# Patient Record
Sex: Male | Born: 1995 | Race: White | Hispanic: No | Marital: Single | Smoking: Never smoker
Health system: Southern US, Community
[De-identification: ages and names within clinical notes are randomized; demographics above are authoritative.]

## PROBLEM LIST (undated history)

## (undated) HISTORY — PX: APPENDECTOMY: SHX54

---

## 2015-01-18 ENCOUNTER — Encounter (HOSPITAL_BASED_OUTPATIENT_CLINIC_OR_DEPARTMENT_OTHER): Payer: Self-pay | Admitting: *Deleted

## 2015-01-18 ENCOUNTER — Emergency Department (HOSPITAL_BASED_OUTPATIENT_CLINIC_OR_DEPARTMENT_OTHER): Payer: 59

## 2015-01-18 ENCOUNTER — Emergency Department (HOSPITAL_BASED_OUTPATIENT_CLINIC_OR_DEPARTMENT_OTHER)
Admission: EM | Admit: 2015-01-18 | Discharge: 2015-01-18 | Disposition: A | Discharge status: Home / Self Care | Payer: 59 | Attending: Emergency Medicine | Admitting: Emergency Medicine

## 2015-01-18 DIAGNOSIS — S93402A Sprain of unspecified ligament of left ankle, initial encounter: Secondary | ICD-10-CM | POA: Diagnosis not present

## 2015-01-18 DIAGNOSIS — Y998 Other external cause status: Secondary | ICD-10-CM | POA: Diagnosis not present

## 2015-01-18 DIAGNOSIS — Y92310 Basketball court as the place of occurrence of the external cause: Secondary | ICD-10-CM | POA: Diagnosis not present

## 2015-01-18 DIAGNOSIS — X58XXXA Exposure to other specified factors, initial encounter: Secondary | ICD-10-CM | POA: Insufficient documentation

## 2015-01-18 DIAGNOSIS — Y9367 Activity, basketball: Secondary | ICD-10-CM | POA: Diagnosis not present

## 2015-01-18 DIAGNOSIS — S99912A Unspecified injury of left ankle, initial encounter: Secondary | ICD-10-CM | POA: Diagnosis present

## 2015-01-18 MED ORDER — NAPROXEN 500 MG PO TABS: 500.0000 mg | ORAL_TABLET | Freq: Two times a day (BID) | ORAL | Status: AC

## 2015-01-18 MED ORDER — HYDROCODONE-ACETAMINOPHEN 5-325 MG PO TABS
1.0000 | ORAL_TABLET | Freq: Once | ORAL | Status: AC
  Administered 2015-01-18: 1 via ORAL
  Filled 2015-01-18: qty 1

## 2015-01-18 NOTE — ED Notes (Signed)
Injury to his left ankle while playing basketball yesterday. Painful to ambulate. Bruising.

## 2015-01-18 NOTE — ED Provider Notes (Signed)
CSN: [id number]     Arrival date & time 01/18/15  2026 History   First MD Initiated Contact with Patient 01/18/15 2050     Chief Complaint  Patient presents with  . Ankle Injury     (Consider location/radiation/quality/duration/timing/severity/associated sxs/prior Treatment) HPI Comments: Patient presents with acute onset of left ankle pain starting yesterday when he twisted his ankle inward while playing basketball. Patient felt and heard a pop. He was able to ambulate however had a lot of pain with ambulation. He had the ankle wrapped with an Ace wrap. Patient took ibuprofen which provided some relief. He was able to be up and about on his ankle today. No knee pain or hip pain. No other treatments prior to arrival.  The history is provided by the patient.    History reviewed. No pertinent past medical history. Past Surgical History  Procedure Laterality Date  . Appendectomy     No family history on file. Social History  Substance Use Topics  . Smoking status: Never Smoker   . Smokeless tobacco: None  . Alcohol Use: No    Review of Systems  Constitutional: Negative for activity change.  Musculoskeletal: Positive for joint swelling, arthralgias and gait problem. Negative for back pain and neck pain.  Skin: Negative for wound.  Neurological: Negative for weakness and numbness.      Allergies  Review of patient's allergies indicates no known allergies.  Home Medications   Prior to Admission medications   Not on File   BP 116/58 mmHg  Pulse 55  Temp(Src) 98.3 F (36.8 C) (Oral)  Resp 20  Ht  (1.854 m)  Wt 171 lb (77.565 kg)  BMI 22.57 kg/m2  SpO2 99% Physical Exam  Constitutional: He appears well-developed and well-nourished.  HENT:  Head: Normocephalic and atraumatic.  Eyes: Conjunctivae are normal.  Neck: Normal range of motion. Neck supple.  Cardiovascular: Normal pulses.   Pulses:      Dorsalis pedis pulses are 2+ on the right side, and 2+ on the  left side.       Posterior tibial pulses are 2+ on the right side, and 2+ on the left side.  Pulmonary/Chest: No respiratory distress.  Musculoskeletal: He exhibits edema and tenderness.       Left hip: Normal.       Left knee: Normal.       Left ankle: He exhibits decreased range of motion and swelling. He exhibits no ecchymosis. Tenderness. Lateral malleolus (Significant overlying swelling) tenderness found. No head of 5th metatarsal and no proximal fibula tenderness found. Achilles tendon normal. Achilles tendon exhibits normal Thompson's test results.       Left lower leg: Normal.       Left foot: Normal. There is normal range of motion, no tenderness and no bony tenderness.  Neurological: He is alert. No sensory deficit.  Motor, sensation, and vascular distal to the injury is fully intact.   Skin: Skin is warm and dry.  Psychiatric: He has a normal mood and affect.  Nursing note and vitals reviewed.   ED Course  Procedures (including critical care time) Labs Review Labs Reviewed - No data to display  Imaging Review Dg Ankle Complete Left  01/18/2015   CLINICAL DATA:  Injury to the left ankle playing basketball  EXAM: LEFT ANKLE COMPLETE - 3+ VIEW  COMPARISON:  None.  FINDINGS: There is no evidence of fracture, dislocation, or joint effusion. There is no evidence of arthropathy or other focal bone abnormality.  Lateral malleolar soft tissue swelling is evident.  IMPRESSION: Lateral malleolar soft tissue swelling which may indicate ligamentous injury. No fracture or dislocation.   Electronically Signed   By: Christiana Pellant M.D.   On: 01/18/2015 20:58   I have personally reviewed and evaluated these images and lab results as part of my medical decision-making.   EKG Interpretation None       9:08 PM Patient seen and examined. Medications ordered.   Vital signs reviewed and are as follows: BP 116/58 mmHg  Pulse 55  Temp(Src) 98.3 F (36.8 C) (Oral)  Resp 20  Ht 6\' 1"  (1.854  m)  Wt 171 lb (77.565 kg)  BMI 22.57 kg/m2  SpO2 99%  Patient given crutches and ASO. I suggested that he remain nonweightbearing until he can follow-up with his doctor when he returns to [place] and a few days. I have a high suspicion for significant ligamentous injury.  NSAIDs for pain otherwise.   MDM   Final diagnoses:  Ankle sprain, left, initial encounter   Patient with ankle sprain. Suspect high-grade sprain. Lower extremity is neurovascularly intact without signs of compartment syndrome. Crutches and ASO given.    Renne Crigler, PA-C 01/18/15 2157  Gwyneth Sprout, MD 01/18/15 [phone]

## 2015-01-18 NOTE — ED Notes (Signed)
Patient transported to X-ray 

## 2015-01-18 NOTE — Discharge Instructions (Signed)
Please read and follow all provided instructions.  Your diagnoses today include:  1. Ankle sprain, left, initial encounter     Tests performed today include:  An x-ray of your ankle - does NOT show any broken bones  Vital signs. See below for your results today.   Medications prescribed:   Naproxen - anti-inflammatory pain medication  Do not exceed  naproxen every 12 hours, take with food  You have been prescribed an anti-inflammatory medication or NSAID. Take with food. Take smallest effective dose for the shortest duration needed for your pain. Stop taking if you experience stomach pain or vomiting.   Take any prescribed medications only as directed.  Home care instructions:   Follow any educational materials contained in this packet  Follow R.I.C.E. Protocol:  R - rest your injury   I  - use ice on injury without applying directly to skin  C - compress injury with bandage or splint  E - elevate the injury as much as possible  Follow-up instructions: Please follow-up with your primary care provider in the next week for recheck of your ankle. There is a high probability of a torn ligament based on your exam and injury. Please use the crutches and ankle brace until follow-up is obtained. You may need to see an orthopedic doctor.   Return instructions:   Please return if your toes are numb or tingling, appear gray or blue, or you have severe pain (also elevate leg and loosen splint or wrap)  Please return to the Emergency Department if you experience worsening symptoms.   Please return if you have any other emergent concerns.  Additional Information:  Your vital signs today were: BP 116/58 mmHg   Pulse 55   Temp(Src) 98.3 F (36.8 C) (Oral)   Resp 20   Ht  (1.854 m)   Wt 171 lb (77.565 kg)   BMI 22.57 kg/m2   SpO2 99% If your blood pressure (BP) was elevated above 135/85 this visit, please have this repeated by your doctor within one  month. --------------

## 2016-12-21 IMAGING — DX DG ANKLE COMPLETE 3+V*L*
3 series · 3 of 3 positions shown · non-contrast
Comparison: None.

CLINICAL DATA: Injury to the left ankle playing basketball

EXAM:
LEFT ANKLE COMPLETE - 3+ VIEW

[ankle ap]
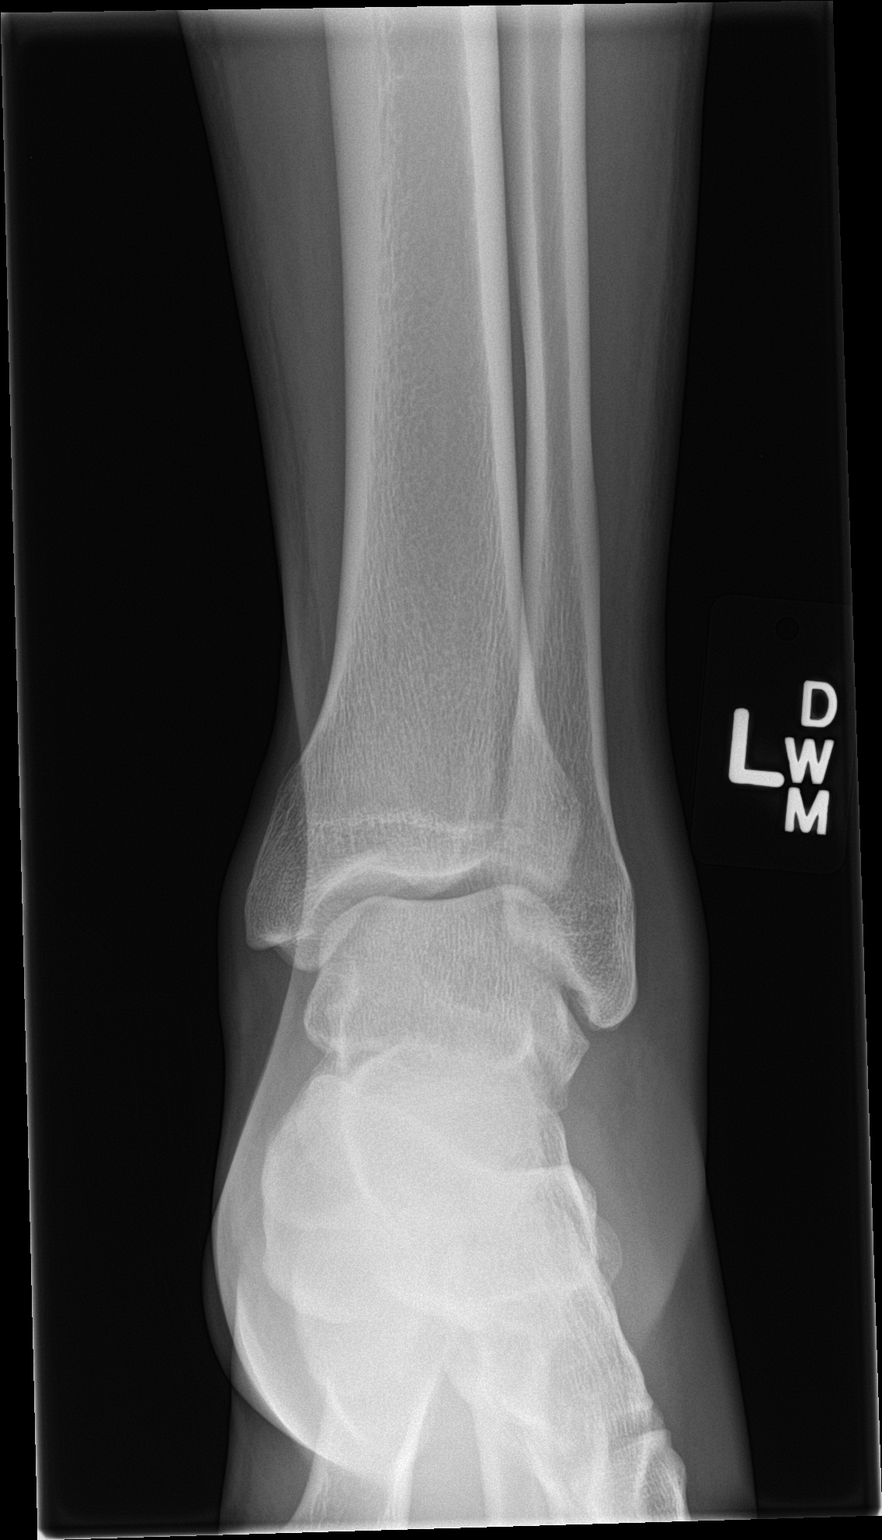

[ankle lat]
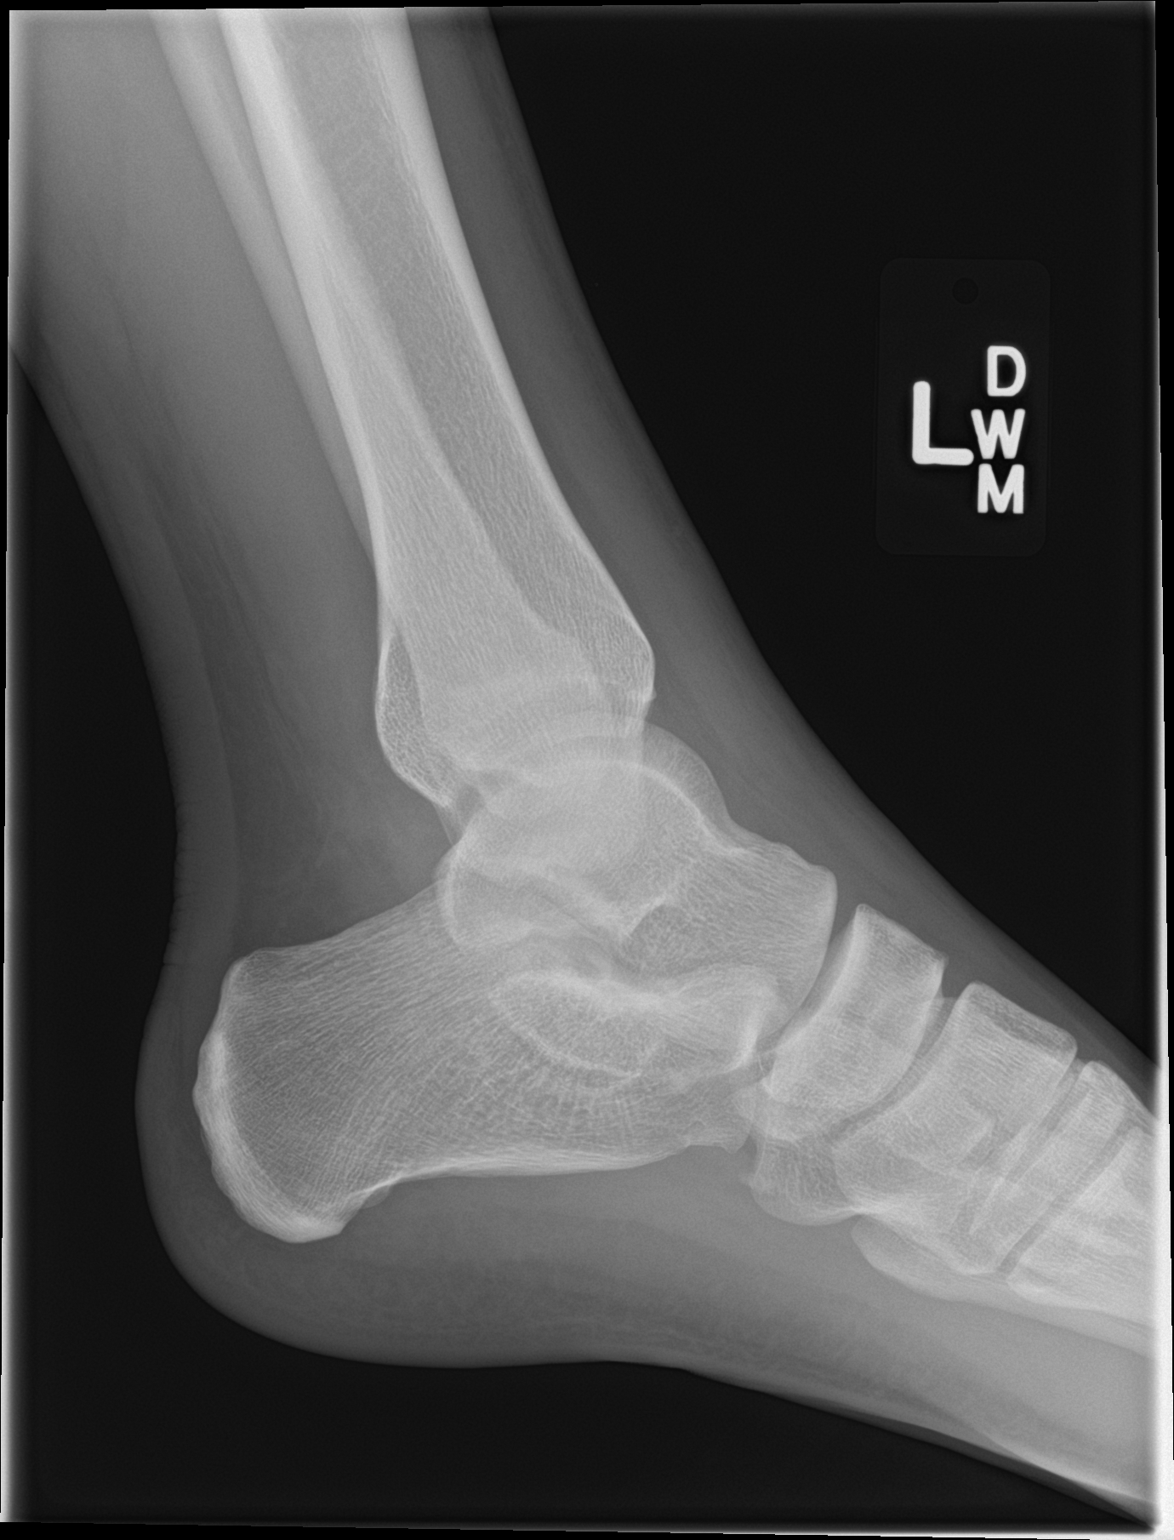

[ankle obl]
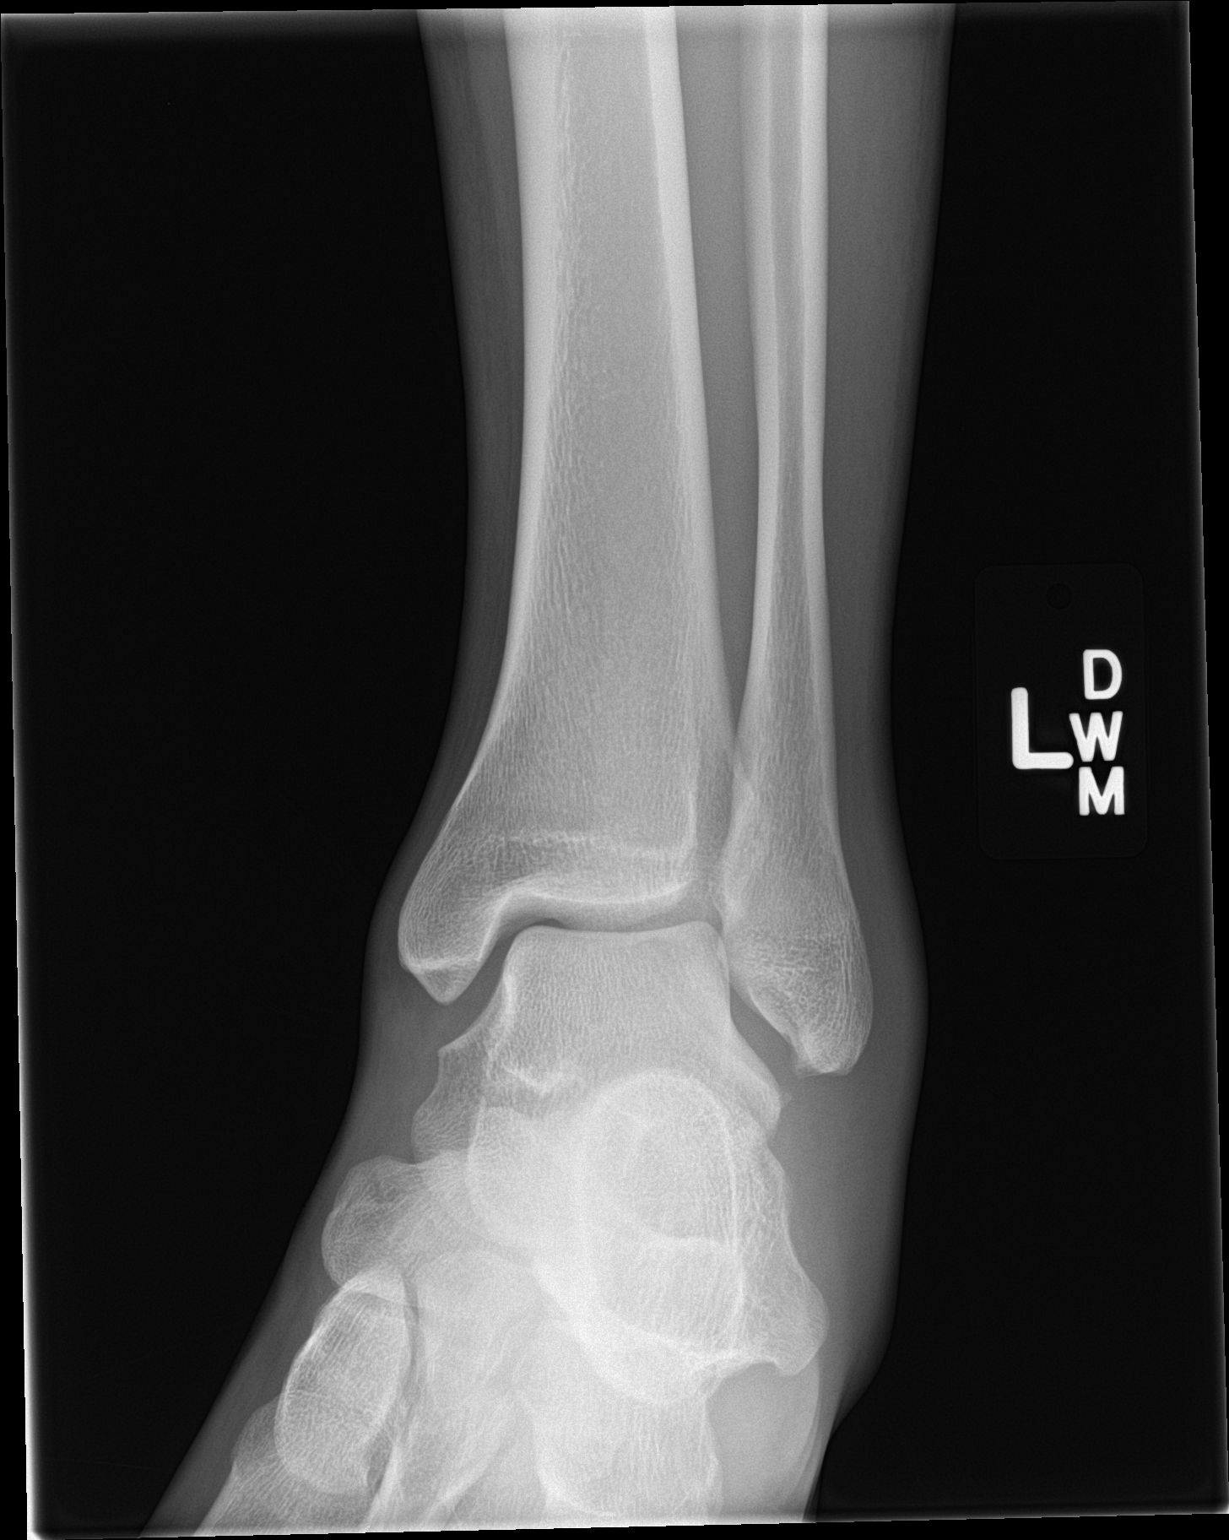

[3 of 3 positions shown; findings below may reference images not displayed]

FINDINGS: There is no evidence of fracture, dislocation, or joint effusion.
There is no evidence of arthropathy or other focal bone abnormality.
Lateral malleolar soft tissue swelling is evident.
IMPRESSION: Lateral malleolar soft tissue swelling which may indicate
ligamentous injury. No fracture or dislocation.
